# Patient Record
Sex: Female | Born: 1980 | Race: Black or African American | Hispanic: No | Marital: Single | Smoking: Current every day smoker
Health system: Southern US, Community
[De-identification: ages and names within clinical notes are randomized; demographics above are authoritative.]

## PROBLEM LIST (undated history)

## (undated) HISTORY — PX: ABDOMINAL HYSTERECTOMY: SHX81

---

## 2018-03-27 ENCOUNTER — Other Ambulatory Visit: Payer: Self-pay

## 2018-03-27 ENCOUNTER — Encounter (HOSPITAL_BASED_OUTPATIENT_CLINIC_OR_DEPARTMENT_OTHER): Payer: Self-pay | Admitting: Emergency Medicine

## 2018-03-27 ENCOUNTER — Emergency Department (HOSPITAL_BASED_OUTPATIENT_CLINIC_OR_DEPARTMENT_OTHER)
Admission: EM | Admit: 2018-03-27 | Discharge: 2018-03-27 | Disposition: A | Discharge status: Home / Self Care | Payer: Self-pay | Attending: Emergency Medicine | Admitting: Emergency Medicine

## 2018-03-27 ENCOUNTER — Emergency Department (HOSPITAL_BASED_OUTPATIENT_CLINIC_OR_DEPARTMENT_OTHER): Payer: Self-pay

## 2018-03-27 DIAGNOSIS — F17210 Nicotine dependence, cigarettes, uncomplicated: Secondary | ICD-10-CM | POA: Insufficient documentation

## 2018-03-27 DIAGNOSIS — J209 Acute bronchitis, unspecified: Secondary | ICD-10-CM | POA: Insufficient documentation

## 2018-03-27 LAB — CBC WITH DIFFERENTIAL/PLATELET
BASOS PCT: 0 %
Basophils Absolute: 0 10*3/uL (ref 0.0–0.1)
Eosinophils Absolute: 0.2 10*3/uL (ref 0.0–0.7)
Eosinophils Relative: 3 %
HEMATOCRIT: 35.3 % — AB (ref 36.0–46.0)
HEMOGLOBIN: 12.2 g/dL (ref 12.0–15.0)
LYMPHS ABS: 1.9 10*3/uL (ref 0.7–4.0)
LYMPHS PCT: 24 %
MCH: 30.2 pg (ref 26.0–34.0)
MCHC: 34.6 g/dL (ref 30.0–36.0)
MCV: 87.4 fL (ref 78.0–100.0)
MONOS PCT: 5 %
Monocytes Absolute: 0.4 10*3/uL (ref 0.1–1.0)
NEUTROS ABS: 5.5 10*3/uL (ref 1.7–7.7)
NEUTROS PCT: 68 %
Platelets: 238 10*3/uL (ref 150–400)
RBC: 4.04 MIL/uL (ref 3.87–5.11)
RDW: 12.8 % (ref 11.5–15.5)
WBC: 8 10*3/uL (ref 4.0–10.5)

## 2018-03-27 LAB — BASIC METABOLIC PANEL
Anion gap: 11 (ref 5–15)
BUN: 13 mg/dL (ref 6–20)
CHLORIDE: 104 mmol/L (ref 98–111)
CO2: 24 mmol/L (ref 22–32)
CREATININE: 0.79 mg/dL (ref 0.44–1.00)
Calcium: 8.9 mg/dL (ref 8.9–10.3)
GFR calc Af Amer: >60 mL/min (ref >60)
GFR calc non Af Amer: >60 mL/min (ref >60)
GLUCOSE: 108 mg/dL — AB (ref 70–99)
POTASSIUM: 3.3 mmol/L — AB (ref 3.5–5.1)
Sodium: 139 mmol/L (ref 135–145)

## 2018-03-27 LAB — D-DIMER, QUANTITATIVE: D-Dimer, Quant: 0.56 ug/mL-FEU — ABNORMAL HIGH (ref 0.00–0.50)

## 2018-03-27 LAB — TROPONIN I: Troponin I: <0.03 ng/mL (ref <0.03)

## 2018-03-27 MED ORDER — IOPAMIDOL (ISOVUE-370) INJECTION 76%
100.0000 mL | Freq: Once | INTRAVENOUS | Status: AC | PRN
  Administered 2018-03-27: 77 mL via INTRAVENOUS

## 2018-03-27 MED ORDER — ALBUTEROL SULFATE (2.5 MG/3ML) 0.083% IN NEBU
2.5000 mg | INHALATION_SOLUTION | Freq: Once | RESPIRATORY_TRACT | Status: AC
  Administered 2018-03-27: 2.5 mg via RESPIRATORY_TRACT
  Filled 2018-03-27: qty 3

## 2018-03-27 MED ORDER — POTASSIUM CHLORIDE CRYS ER 20 MEQ PO TBCR
40.0000 meq | EXTENDED_RELEASE_TABLET | Freq: Once | ORAL | Status: AC
  Administered 2018-03-27: 40 meq via ORAL
  Filled 2018-03-27: qty 2

## 2018-03-27 MED ORDER — ALBUTEROL SULFATE HFA 108 (90 BASE) MCG/ACT IN AERS
2.0000 | INHALATION_SPRAY | Freq: Once | RESPIRATORY_TRACT | Status: AC
  Administered 2018-03-27: 2 via RESPIRATORY_TRACT
  Filled 2018-03-27: qty 6.7

## 2018-03-27 NOTE — ED Notes (Signed)
Pt out of room for testing. 

## 2018-03-27 NOTE — ED Provider Notes (Addendum)
[facility] EMERGENCY DEPARTMENT Provider Note   CSN: [id number] Arrival date & time: 03/27/18  0911     History   Chief Complaint Chief Complaint  Patient presents with  . Cough  . Shortness of Breath    HPI Tamara Coffey is a 37 y.o. female.  HPI  37 year old female with no reported significant medical problems presents with chest tightness and dyspnea.  For about 1-1/2 weeks she is been having a cough with occasional white sputum.  No fevers.  She had a little bit of hoarse voice at the beginning but that has resolved.  She flew in here from [place] yesterday and since then has noticed some bilateral ankle and feet edema.  When she was walking from the airplane she started noticing shortness of breath and chest tightness.  This occurred after walking about 10 minutes.  Has never had that before.  She is also having right-sided back pain this been going on before the plane flight and she thinks it was from when she was coughing real hard.  No vomiting.  No prior history of DVT/PE.  She does not currently smoke and is not on birth control.  No recent surgeries.  Currently she has no dyspnea or chest tightness.  History reviewed. No pertinent past medical history.  There are no active problems to display for this patient.   Past Surgical History:  Procedure Laterality Date  . ABDOMINAL HYSTERECTOMY       OB History   None      Home Medications    Prior to Admission medications   Medication Sig Start Date End Date Taking? Authorizing Provider  Multiple Vitamin (MULTIVITAMIN) capsule Take 1 capsule by mouth daily.   Yes [provider]    Family History No family history on file.  Social History Social History   Tobacco Use  . Smoking status: Current Every Day Smoker    Types: E-cigarettes  . Smokeless tobacco: Never Used  Substance Use Topics  . Alcohol use: Yes  . Drug use: Never     Allergies   Patient has no known  allergies.   Review of Systems Review of Systems  Constitutional: Negative for fever.  HENT: Negative for sore throat.   Respiratory: Positive for cough, chest tightness and shortness of breath.   Cardiovascular: Positive for leg swelling. Negative for chest pain.  Gastrointestinal: Negative for vomiting.  Musculoskeletal: Positive for back pain.  All other systems reviewed and are negative.    Physical Exam Updated Vital Signs BP 105/74   Pulse 91   Temp 98 F (36.7 C) (Oral)   Resp (!) 22   Ht 5\' 9"  (1.753 m)   Wt 102.1 kg   SpO2 100%   BMI 33.23 kg/m   Physical Exam  Constitutional: She is oriented to person, place, and time. She appears well-developed and well-nourished.  HENT:  Head: Normocephalic and atraumatic.  Right Ear: External ear normal.  Left Ear: External ear normal.  Nose: Nose normal.  Eyes: Right eye exhibits no discharge. Left eye exhibits no discharge.  Cardiovascular: Normal rate, regular rhythm and normal heart sounds.  Pulmonary/Chest: Effort normal. No accessory muscle usage. No tachypnea. She has wheezes in the right lower field and the left upper field.  Abdominal: Soft. There is no tenderness.  Musculoskeletal:       Thoracic back: She exhibits tenderness.       Back:       Right lower leg: She exhibits edema.  Left lower leg: She exhibits edema.  Trace, non pitting pedal edema  Neurological: She is alert and oriented to person, place, and time.  Skin: Skin is warm and dry.  Nursing note and vitals reviewed.    ED Treatments / Results  Labs (all labs ordered are listed, but only abnormal results are displayed) Labs Reviewed  BASIC METABOLIC PANEL - Abnormal; Notable for the following components:      Result Value   Potassium 3.3 (*)    Glucose, Bld 108 (*)    All other components within normal limits  D-DIMER, QUANTITATIVE (NOT AT [facility]) - Abnormal; Notable for the following components:   D-Dimer, Quant 0.56 (*)    All  other components within normal limits  CBC WITH DIFFERENTIAL/PLATELET - Abnormal; Notable for the following components:   HCT 35.3 (*)    All other components within normal limits  TROPONIN I    EKG EKG Interpretation  Date/Time:  Saturday March 27 2018 10:26:17 EDT Ventricular Rate:  81 PR Interval:    QRS Duration: 111 QT Interval:  384 QTC Calculation: 446 R Axis:   98 Text Interpretation:  Normal sinus rhythm Borderline right axis deviation no acute ST/T changes No old tracing to compare Confirmed by Pricilla Loveless [phone]) on 03/27/2018 10:38:16 AM Also confirmed by Pricilla Loveless [phone]), editor Sheppard Evens ([id number])  on 03/27/2018 10:55:19 AM   Radiology Dg Chest 2 View  Result Date: 03/27/2018 CLINICAL DATA:  Cough, congestion and shortness of breath for 1-2 weeks. EXAM: CHEST - 2 VIEW COMPARISON:  None. FINDINGS: The heart size and mediastinal contours are within normal limits. Both lungs are clear. No pleural effusion or pneumothorax. The visualized skeletal structures are unremarkable. IMPRESSION: Normal chest radiographs. Electronically Signed   By: Amie Portland M.D.   On: 03/27/2018 10:27   Ct Angio Chest Pe W/cm &/or Wo Cm  Result Date: 03/27/2018 CLINICAL DATA:  Cough with shortness of breath after airplane travel. Elevated D-dimer levels. EXAM: CT ANGIOGRAPHY CHEST WITH CONTRAST TECHNIQUE: Multidetector CT imaging of the chest was performed using the standard protocol during bolus administration of intravenous contrast. Multiplanar CT image reconstructions and MIPs were obtained to evaluate the vascular anatomy. CONTRAST:  77mL ISOVUE-370 IOPAMIDOL (ISOVUE-370) INJECTION 76% COMPARISON:  Chest radiograph same date. FINDINGS: Cardiovascular: The pulmonary arteries are adequately opacified with contrast to the level of the subsegmental branches. There is no evidence of acute pulmonary embolism. No systemic arterial abnormalities are identified. The heart size is  normal. There is no pericardial effusion. Mediastinum/Nodes: There are no enlarged mediastinal, hilar or axillary lymph nodes.There is a small amount of residual thymic tissue in the anterior mediastinum. The thyroid gland, trachea and esophagus demonstrate no significant findings. Lungs/Pleura: There is no pleural effusion or pneumothorax. There is mild dependent atelectasis in both lungs. In the left lower lobe, there are small ill-defined ground-glass densities on images 61-68 of series 7 which could be inflammatory. There is no confluent airspace opacity or suspicious pulmonary nodule. Upper abdomen: Gallstones are noted. Ill-defined low-density in the left hepatic lobe adjacent to the falciform ligament is typical for focal fat. Musculoskeletal/Chest wall: There is no chest wall mass or suspicious osseous finding. Review of the MIP images confirms the above findings. IMPRESSION: 1. No evidence of acute pulmonary embolism or other acute vascular findings. 2. Mild ground-glass nodularity in the left lower lobe could be inflammatory. There is no consolidation or suspicious pulmonary nodule. 3. Cholelithiasis. Electronically Signed   By: Carey Bullocks  M.D.   On: 03/27/2018 11:05    Procedures Procedures (including critical care time)  Medications Ordered in ED Medications  albuterol (PROVENTIL HFA;VENTOLIN HFA) 108 (90 Base) MCG/ACT inhaler 2 puff (has no administration in time range)  albuterol (PROVENTIL) (2.5 MG/3ML) 0.083% nebulizer solution 2.5 mg (2.5 mg Nebulization Given 03/27/18 0957)  potassium chloride SA (K-DUR,KLOR-CON) CR tablet 40 mEq (40 mEq Oral Given 03/27/18 1102)  iopamidol (ISOVUE-370) 76 % injection 100 mL (77 mLs Intravenous Contrast Given 03/27/18 1041)     Initial Impression / Assessment and Plan / ED Course  I have reviewed the triage vital signs and the nursing notes.  Pertinent labs & imaging results that were available during my care of the patient were reviewed by me  and considered in my medical decision making (see chart for details).     Given her recent trip with the subsequent chest tightness and shortness of breath, work-up for PE obtained. Thus CT obtained in an otherwise low risk situation.  This is negative.  No obvious pneumonia.  Labs are otherwise reassuring besides mild hypokalemia.  She feels a little bit better with albuterol.  I will give her an inhaler to try at home.  Most likely this is all some mild bronchitis.  Given no obvious bacterial etiology I think no antibiotics are warranted.  She was informed of the gallstone seen and instructed to follow-up with PCP and discussed return precautions. Pedal edema is likely from the flight  Final Clinical Impressions(s) / ED Diagnoses   Final diagnoses:  Acute bronchitis, unspecified organism    ED Discharge Orders    None       Pricilla Loveless, MD 03/27/18 1119    Pricilla Loveless, MD 03/27/18 1120

## 2018-03-27 NOTE — ED Notes (Signed)
Patient transported to CT 

## 2018-03-27 NOTE — Discharge Instructions (Addendum)
Develop fever, vomiting, shortness of breath, chest pain, or any other new/concerning symptoms and return to the ER for evaluation.  You may take decongestants, cough medicine, and use the albuterol inhaler 1-2 puffs every 4 hours as needed.  Return if you are needing it more often.  Your CT scan shows gallstones in your gallbladder.  These by themselves are not a problem unless she started developing pain in your right upper abdomen or vomiting.  Follow-up with a surgeon as needed in your hometown.

## 2018-03-27 NOTE — ED Triage Notes (Addendum)
Pt c/o cough x 1 1/2 weeks. Pt reports after getting off plane noticed she was short of breath while walking. Pt is visiting from [place]. Pt has tried mucinex for symptoms. Pt denies fever. Pt Vapes, gets it from store and does not use THC or CBD oil in it.

## 2019-07-03 IMAGING — CT CT ANGIO CHEST
2 of 10 series · 16 of 36 positions shown · IV contrast (iopamidol)
Comparison: Chest radiograph same date.

CLINICAL DATA: Cough with shortness of breath after airplane
travel. Elevated D-dimer levels.

EXAM:
CT ANGIOGRAPHY CHEST WITH CONTRAST
TECHNIQUE: Multidetector CT imaging of the chest was performed using the
standard protocol during bolus administration of intravenous
contrast. Multiplanar CT image reconstructions and MIPs were
obtained to evaluate the vascular anatomy.
CONTRAST:  77mL XWFARZ-CHJ IOPAMIDOL (XWFARZ-CHJ) INJECTION 76%

[Series 6: pe thins · axial · 0.82mm/px · z∈[-303,-23]mm · 15 of 321 slices shown]
[im 21/321  lung]
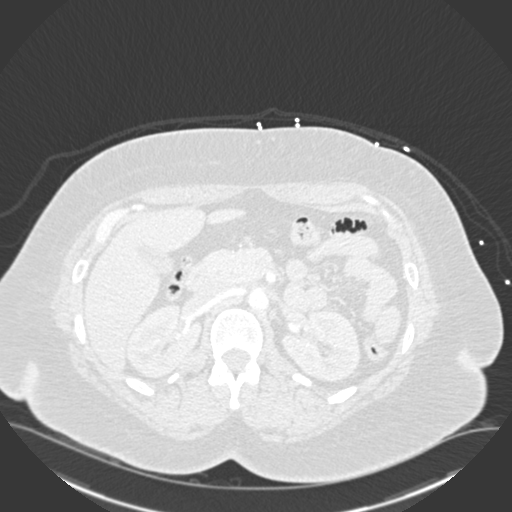
[im 41/321  mediastinal]
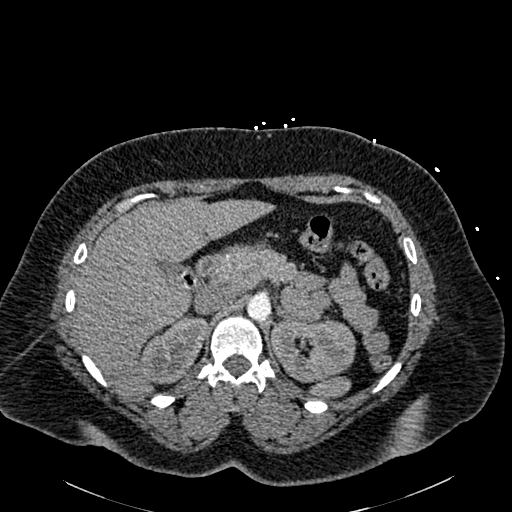
[im 61/321  lung]
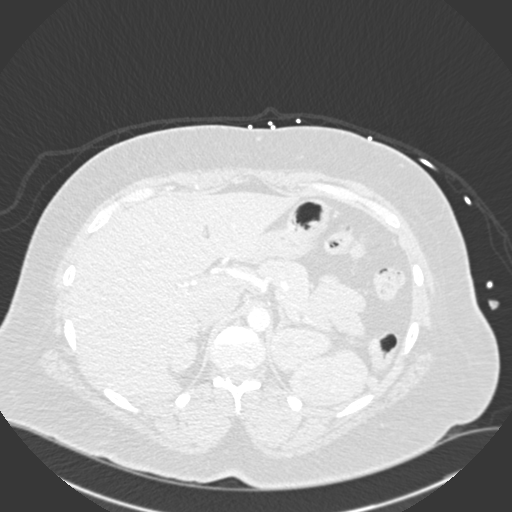
[im 81/321  mediastinal]
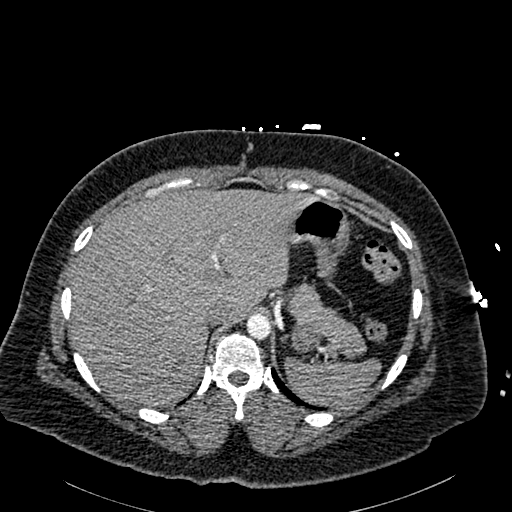
[im 101/321  lung]
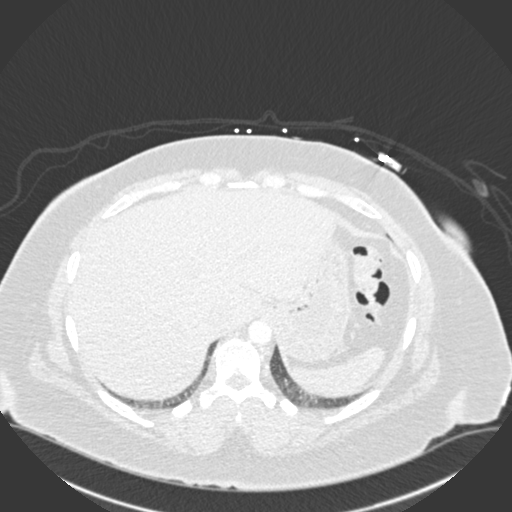
[im 121/321  mediastinal]
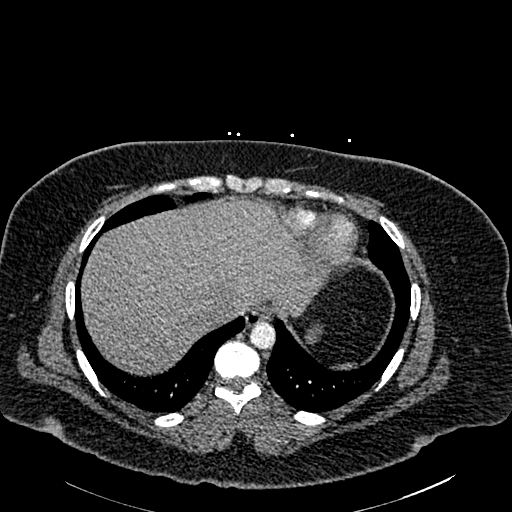
[im 141/321  lung]
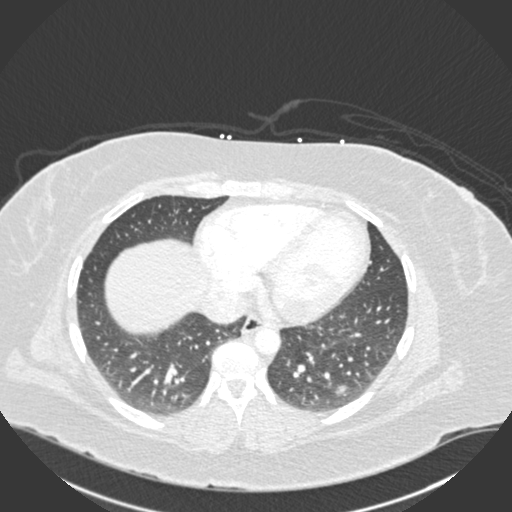
[im 161/321  mediastinal]
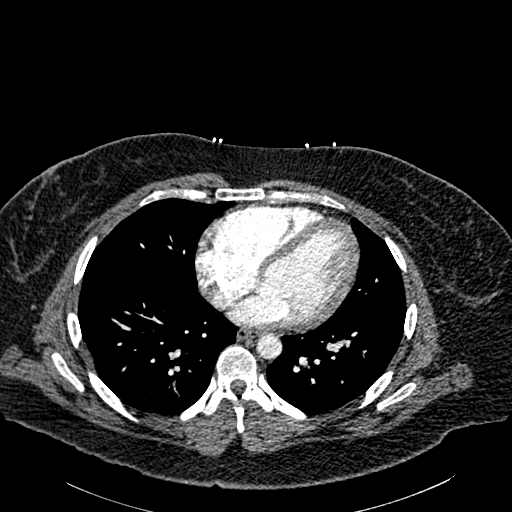
[im 181/321  lung]
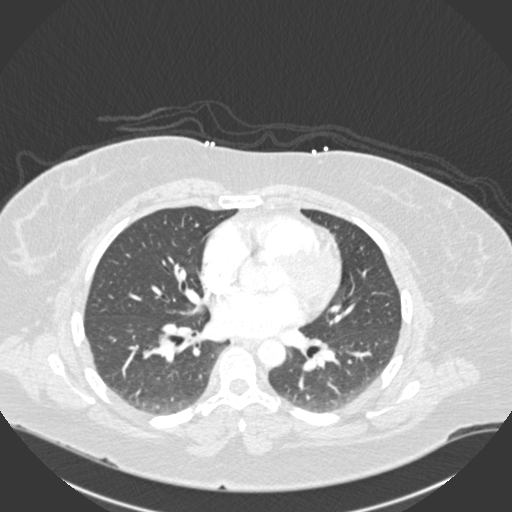
[im 201/321  mediastinal]
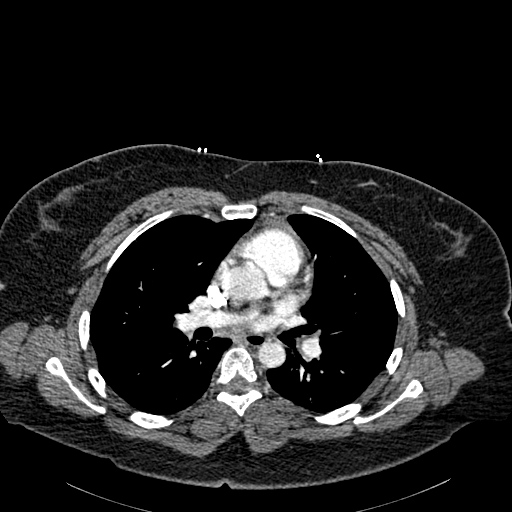
[im 221/321  lung]
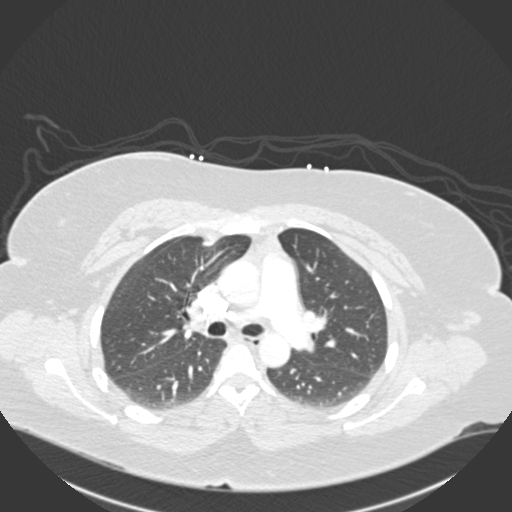
[im 241/321  mediastinal]
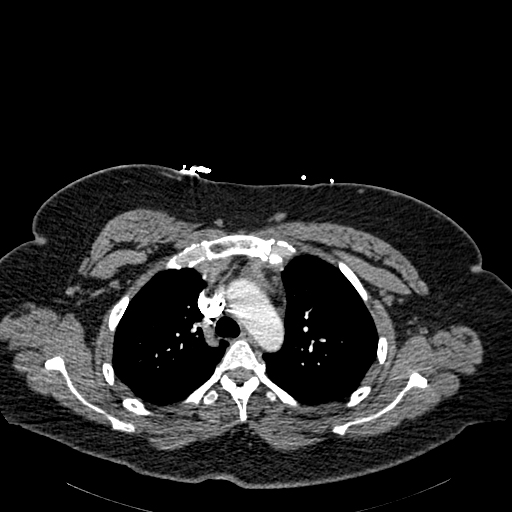
[im 261/321  lung]
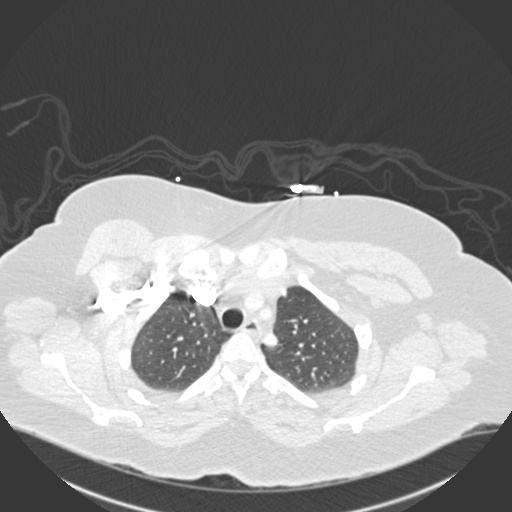
[im 281/321  mediastinal]
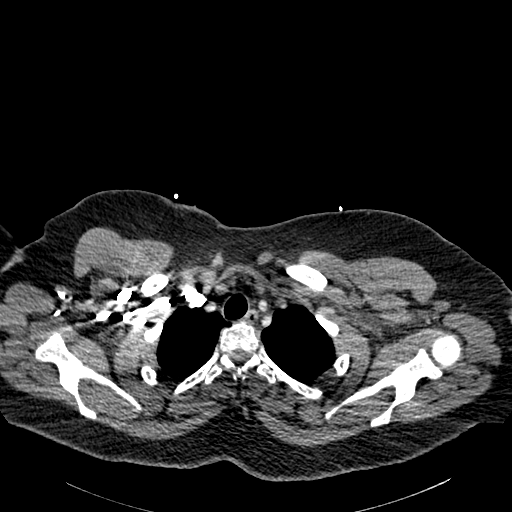
[im 301/321  lung]
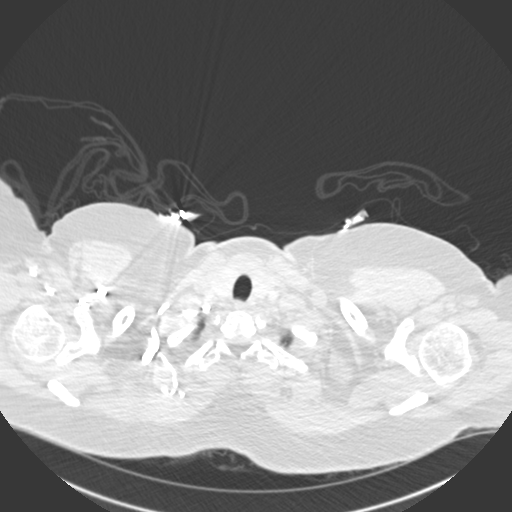

[Series 8: pe coronal mpr · coronal · 0.64mm/px · 1 of 140 slices shown]
[im 70/140  mediastinal]
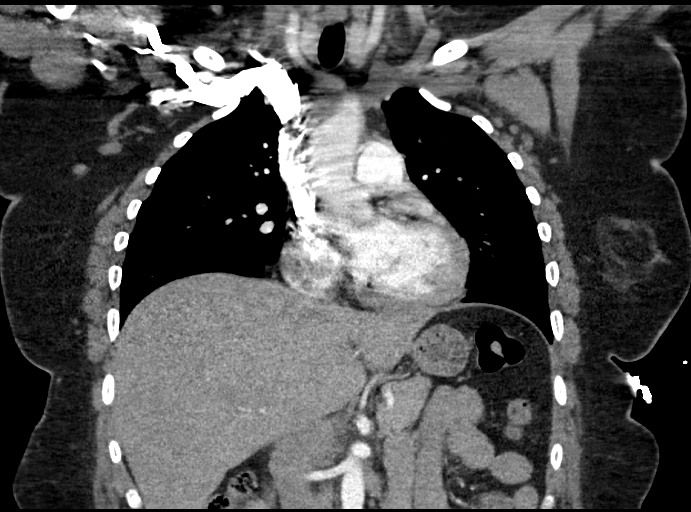

[16 of 36 positions shown; findings below may reference images not displayed]

FINDINGS: Cardiovascular: The pulmonary arteries are adequately opacified with
contrast to the level of the subsegmental branches. There is no
evidence of acute pulmonary embolism. No systemic arterial
abnormalities are identified. The heart size is normal. There is no
pericardial effusion.

Mediastinum/Nodes: There are no enlarged mediastinal, hilar or
axillary lymph nodes.There is a small amount of residual thymic
tissue in the anterior mediastinum. The thyroid gland, trachea and
esophagus demonstrate no significant findings.

Lungs/Pleura: There is no pleural effusion or pneumothorax. There is
mild dependent atelectasis in both lungs. In the left lower lobe,
of series 7 which could be inflammatory. There is no confluent
airspace opacity or suspicious pulmonary nodule.

Upper abdomen: Gallstones are noted. Ill-defined low-density in the
left hepatic lobe adjacent to the falciform ligament is typical for
focal fat.

Musculoskeletal/Chest wall: There is no chest wall mass or
suspicious osseous finding.

Review of the MIP images confirms the above findings.
IMPRESSION: 1. No evidence of acute pulmonary embolism or other acute vascular
findings.
2. Mild ground-glass nodularity in the left lower lobe could be
inflammatory. There is no consolidation or suspicious pulmonary
nodule.
3. Cholelithiasis.
